# Patient Record
Sex: Male | Born: 1970 | Hispanic: Yes | Marital: Married | Smoking: Never smoker
Health system: Southern US, Community
[De-identification: ages and names within clinical notes are randomized; demographics above are authoritative.]

## PROBLEM LIST (undated history)

## (undated) DIAGNOSIS — E7800 Pure hypercholesterolemia, unspecified: Secondary | ICD-10-CM

## (undated) DIAGNOSIS — E119 Type 2 diabetes mellitus without complications: Secondary | ICD-10-CM

## (undated) DIAGNOSIS — K219 Gastro-esophageal reflux disease without esophagitis: Secondary | ICD-10-CM

## (undated) HISTORY — PX: APPENDECTOMY: SHX54

---

## 2006-07-12 ENCOUNTER — Emergency Department: Payer: Self-pay | Admitting: Emergency Medicine

## 2010-10-26 ENCOUNTER — Emergency Department: Payer: Self-pay | Admitting: Emergency Medicine

## 2011-04-24 ENCOUNTER — Observation Stay: Payer: Self-pay | Admitting: Internal Medicine

## 2014-01-16 ENCOUNTER — Emergency Department: Payer: Self-pay | Admitting: Emergency Medicine

## 2014-01-16 LAB — LIPASE, BLOOD: LIPASE: 152 U/L (ref 73–393)

## 2014-01-16 LAB — BASIC METABOLIC PANEL
Anion Gap: 4 — ABNORMAL LOW (ref 7–16)
BUN: 13 mg/dL (ref 7–18)
CALCIUM: 9.4 mg/dL (ref 8.5–10.1)
CHLORIDE: 106 mmol/L (ref 98–107)
CO2: 29 mmol/L (ref 21–32)
Creatinine: 0.88 mg/dL (ref 0.60–1.30)
EGFR (African American): >60
EGFR (Non-African Amer.): >60
Glucose: 97 mg/dL (ref 65–99)
Osmolality: 278 (ref 275–301)
POTASSIUM: 4.3 mmol/L (ref 3.5–5.1)
Sodium: 139 mmol/L (ref 136–145)

## 2014-01-16 LAB — HEPATIC FUNCTION PANEL A (ARMC)
ALBUMIN: 4.6 g/dL (ref 3.4–5.0)
AST: 34 U/L (ref 15–37)
Alkaline Phosphatase: 75 U/L
BILIRUBIN TOTAL: 1 mg/dL (ref 0.2–1.0)
Bilirubin, Direct: 0.2 mg/dL (ref 0.00–0.20)
SGPT (ALT): 46 U/L (ref 12–78)
Total Protein: 8.2 g/dL (ref 6.4–8.2)

## 2014-01-16 LAB — CBC
HCT: 43.9 % (ref 40.0–52.0)
HGB: 15 g/dL (ref 13.0–18.0)
MCH: 31.6 pg (ref 26.0–34.0)
MCHC: 34.2 g/dL (ref 32.0–36.0)
MCV: 93 fL (ref 80–100)
Platelet: 267 10*3/uL (ref 150–440)
RBC: 4.75 10*6/uL (ref 4.40–5.90)
RDW: 12.6 % (ref 11.5–14.5)
WBC: 9.6 10*3/uL (ref 3.8–10.6)

## 2014-01-16 LAB — TROPONIN I: Troponin-I: <0.02 ng/mL

## 2014-01-17 LAB — TROPONIN I: Troponin-I: <0.02 ng/mL

## 2014-01-17 LAB — CK: CK, Total: 278 U/L

## 2014-06-10 ENCOUNTER — Emergency Department: Payer: Self-pay | Admitting: Emergency Medicine

## 2014-09-25 ENCOUNTER — Emergency Department: Payer: Self-pay | Admitting: Emergency Medicine

## 2015-12-17 ENCOUNTER — Emergency Department
Admission: EM | Admit: 2015-12-17 | Discharge: 2015-12-17 | Disposition: A | Discharge status: Home / Self Care | Payer: BLUE CROSS/BLUE SHIELD | Attending: Emergency Medicine | Admitting: Emergency Medicine

## 2015-12-17 ENCOUNTER — Emergency Department: Payer: BLUE CROSS/BLUE SHIELD

## 2015-12-17 ENCOUNTER — Encounter: Payer: Self-pay | Admitting: Emergency Medicine

## 2015-12-17 DIAGNOSIS — R079 Chest pain, unspecified: Secondary | ICD-10-CM | POA: Diagnosis present

## 2015-12-17 DIAGNOSIS — Z79899 Other long term (current) drug therapy: Secondary | ICD-10-CM | POA: Diagnosis not present

## 2015-12-17 DIAGNOSIS — Z7984 Long term (current) use of oral hypoglycemic drugs: Secondary | ICD-10-CM | POA: Diagnosis not present

## 2015-12-17 DIAGNOSIS — E119 Type 2 diabetes mellitus without complications: Secondary | ICD-10-CM | POA: Insufficient documentation

## 2015-12-17 DIAGNOSIS — K219 Gastro-esophageal reflux disease without esophagitis: Secondary | ICD-10-CM

## 2015-12-17 DIAGNOSIS — M7032 Other bursitis of elbow, left elbow: Secondary | ICD-10-CM | POA: Insufficient documentation

## 2015-12-17 DIAGNOSIS — M7022 Olecranon bursitis, left elbow: Secondary | ICD-10-CM

## 2015-12-17 DIAGNOSIS — Y9389 Activity, other specified: Secondary | ICD-10-CM | POA: Insufficient documentation

## 2015-12-17 HISTORY — DX: Gastro-esophageal reflux disease without esophagitis: K21.9

## 2015-12-17 HISTORY — DX: Pure hypercholesterolemia, unspecified: E78.00

## 2015-12-17 HISTORY — DX: Type 2 diabetes mellitus without complications: E11.9

## 2015-12-17 LAB — CBC
HCT: 41 % (ref 40.0–52.0)
Hemoglobin: 14.2 g/dL (ref 13.0–18.0)
MCH: 31.3 pg (ref 26.0–34.0)
MCHC: 34.6 g/dL (ref 32.0–36.0)
MCV: 90.4 fL (ref 80.0–100.0)
PLATELETS: 243 10*3/uL (ref 150–440)
RBC: 4.54 MIL/uL (ref 4.40–5.90)
RDW: 12.3 % (ref 11.5–14.5)
WBC: 10.8 10*3/uL — ABNORMAL HIGH (ref 3.8–10.6)

## 2015-12-17 LAB — BASIC METABOLIC PANEL
Anion gap: 8 (ref 5–15)
BUN: 17 mg/dL (ref 6–20)
CHLORIDE: 102 mmol/L (ref 101–111)
CO2: 30 mmol/L (ref 22–32)
CREATININE: 0.93 mg/dL (ref 0.61–1.24)
Calcium: 9.2 mg/dL (ref 8.9–10.3)
GFR calc Af Amer: >60 mL/min (ref >60)
GFR calc non Af Amer: >60 mL/min (ref >60)
Glucose, Bld: 123 mg/dL — ABNORMAL HIGH (ref 65–99)
Potassium: 3.7 mmol/L (ref 3.5–5.1)
Sodium: 140 mmol/L (ref 135–145)

## 2015-12-17 LAB — TROPONIN I: Troponin I: <0.03 ng/mL (ref <0.031)

## 2015-12-17 MED ORDER — GI COCKTAIL ~~LOC~~
30.0000 mL | Freq: Once | ORAL | Status: AC
  Administered 2015-12-17: 30 mL via ORAL
  Filled 2015-12-17: qty 30

## 2015-12-17 MED ORDER — IBUPROFEN 600 MG PO TABS: ORAL_TABLET | ORAL | Status: DC

## 2015-12-17 MED ORDER — OMEPRAZOLE MAGNESIUM 20 MG PO TBEC: 20.0000 mg | DELAYED_RELEASE_TABLET | Freq: Two times a day (BID) | ORAL | Status: DC

## 2015-12-17 MED ORDER — KETOROLAC TROMETHAMINE 60 MG/2ML IM SOLN
60.0000 mg | Freq: Once | INTRAMUSCULAR | Status: AC
  Administered 2015-12-17: 60 mg via INTRAMUSCULAR
  Filled 2015-12-17: qty 2

## 2015-12-17 NOTE — ED Notes (Signed)
Pt. States recurrent chest pain in the past week.  Pt. States having brutitis of the lt. Elbow.  Pt. States tonight pain to lt. Elbow and epigastric pain unrelieved by omeprazole.

## 2015-12-17 NOTE — ED Provider Notes (Signed)
[facility] Emergency Department Provider Note  ____________________________________________  Time seen: Approximately 2:50 AM  I have reviewed the triage vital signs and the nursing notes.   HISTORY  Chief Complaint Chest Pain and Elbow Pain  The patient's parents speak Spanish.  They understand and have the right to the use of a hospital interpreter, however at this time they prefer to speak directly with me in Spanish.  They know that they can ask for an interpreter at any time.   HPI Fernando Vasquez is a 45 y.o. male with a past medical history of diabetes and acid reflux who presents with recurrent chest pain over the last 2 years.  States that he has seen [place] in [place] who encouraged him to use omeprazole but the symptoms seem to be getting gradually worse over time.  They are always worse after he eats and at night and when he lies flat.  He describes the pain as being substernal and going down to his epigastrium.  He has no nausea or vomiting.  He denies shortness of breath.  The symptoms are severe at times although currently they are mild.  He also presents tonight for evaluation of pain and swelling in his left elbow.  He is a Corporate investment bankerconstruction worker and within the last week has started noticing pain movement and swelling all the time.  It feels warm to them but is not red.  He has had no fever or chills.  He has had no trauma to the elbow for which he is aware.  Nothing is making the pain better and movement makes it worse.   Past Medical History  Diagnosis Date  . Diabetes mellitus without complication (HCC)   . GERD (gastroesophageal reflux disease)   . Elevated cholesterol     There are no active problems to display for this patient.   History reviewed. No pertinent past surgical history.  Current Outpatient Rx  Name  Route  Sig  Dispense  Refill  . metFORMIN (GLUCOPHAGE) 500 MG tablet   Oral   Take 500 mg by mouth 2  (two) times daily with a meal.         . omeprazole (PRILOSEC) 20 MG capsule   Oral   Take 20 mg by mouth daily.         . simvastatin (ZOCOR) 40 MG tablet   Oral   Take 40 mg by mouth daily.         Marland Kitchen. ibuprofen (ADVIL,MOTRIN) 600 MG tablet      Take 1 tablet by mouth three times daily with meals   15 tablet   0   . omeprazole (PRILOSEC OTC) 20 MG tablet   Oral   Take 1 tablet (20 mg total) by mouth 2 (two) times daily.   60 tablet   1     Allergies Review of patient's allergies indicates no known allergies.  History reviewed. No pertinent family history.  Social History Social History  Substance Use Topics  . Smoking status: Never Smoker   . Smokeless tobacco: None  . Alcohol Use: No    Review of Systems Constitutional: No fever/chills Eyes: No visual changes. ENT: No sore throat. Cardiovascular: Substernal and epigastric pain worse at night and after eating Respiratory: Denies shortness of breath. Gastrointestinal: Epigastric pain.  No nausea, no vomiting.  No diarrhea.  No constipation. Genitourinary: Negative for dysuria. Musculoskeletal: Pain and swelling in the left elbow Skin: Negative for rash. Neurological: Negative  for headaches, focal weakness or numbness.  10-point ROS otherwise negative.  ____________________________________________   PHYSICAL EXAM:  VITAL SIGNS: ED Triage Vitals  Enc Vitals Group     BP 12/17/15 0129 149/90 mmHg     Pulse Rate 12/17/15 0129 70     Resp 12/17/15 0129 18     Temp 12/17/15 0129 97.8 F (36.6 C)     Temp src --      SpO2 12/17/15 0129 100 %     Weight 12/17/15 0129 158 lb (71.668 kg)     Height 12/17/15 0129  (1.676 m)     Head Cir --      Peak Flow --      Pain Score 12/17/15 0131 6     Pain Loc --      Pain Edu? --      Excl. in GC? --     Constitutional: Alert and oriented. Well appearing and in no acute distress. Eyes: Conjunctivae are normal. PERRL. EOMI. Head: Atraumatic. Nose:  No congestion/rhinnorhea. Mouth/Throat: Mucous membranes are moist.  Oropharynx non-erythematous. Neck: No stridor.  No meningeal signs.   Cardiovascular: Normal rate, regular rhythm. Good peripheral circulation. Grossly normal heart sounds.   Respiratory: Normal respiratory effort.  No retractions. Lungs CTAB. Gastrointestinal: Soft and nontender. No distention.  Musculoskeletal: Swelling and tenderness to palpation of the left elbow consistent with bursitis.  There is no evidence of cellulitis or septic joint.  The patient is able to flex and extend his elbow with some tenderness but not the severe tenderness that would be expected with septic arthritis. Neurologic:  Normal speech and language. No gross focal neurologic deficits are appreciated.  Skin:  Skin is warm, dry and intact. No rash noted.   ____________________________________________   LABS (all labs ordered are listed, but only abnormal results are displayed)  Labs Reviewed  BASIC METABOLIC PANEL - Abnormal; Notable for the following:    Glucose, Bld 123 (*)    All other components within normal limits  CBC - Abnormal; Notable for the following:    WBC 10.8 (*)    All other components within normal limits  TROPONIN I   ____________________________________________  EKG  None ____________________________________________  RADIOLOGY   Dg Chest 2 View  12/17/2015  CLINICAL DATA:  Chest pain EXAM: CHEST  2 VIEW COMPARISON:  01/16/2014 FINDINGS: Normal heart size and mediastinal contours. No acute infiltrate or edema. No effusion or pneumothorax. Calcified pulmonary nodules compatible with previous granulomatous disease. Scarring versus azygos fissure at the right apex. No acute osseous findings. IMPRESSION: No active cardiopulmonary disease. Electronically Signed   By: Marnee Spring M.D.   On: 12/17/2015 03:01   Dg Elbow Complete Left  12/17/2015  CLINICAL DATA:  Left elbow pain for 2 days. No known injury. History of  bursitis. EXAM: LEFT ELBOW - COMPLETE 3+ VIEW COMPARISON:  None. FINDINGS: There is no evidence of fracture, dislocation, or joint effusion. There is no evidence of arthropathy or other focal bone abnormality. Small olecranon spur with associated soft tissue prominence in the region of the olecranon bursa. IMPRESSION: Small olecranon spur with associated soft tissue prominence in the region of the olecranon bursa, suspicious for olecranon bursitis. Electronically Signed   By: Rubye Oaks M.D.   On: 12/17/2015 02:59    ____________________________________________   PROCEDURES  Procedure(s) performed: None  Critical Care performed: No ____________________________________________   INITIAL IMPRESSION / ASSESSMENT AND PLAN / ED COURSE  Pertinent labs & imaging results that  were available during my care of the patient were reviewed by me and considered in my medical decision making (see chart for details).  The signs and symptoms the patient describes are consistent with worsening acid reflux.  He was given a GI cocktail in the emergency department which completely resolved his symptoms.  His lab results are reassuring.  His chest x-ray is unremarkable and his left elbow is consistent with bursitis correlates clinically.  I recommended that he use a compression wrap and he was given an Ace bandage.  I instructed him to use NSAIDs and continue to use the elbow.  I gave him follow-up information for about a week with orthopedics and encouraged him to follow up sooner if it becomes red, increasing swelling, or if he develops other symptoms such as fever.  Also follow-up with his outpatient doctor regarding the acid reflux and I encouraged him to take omeprazole twice daily instead of once a day.  I gave my usual and customary return precautions.     ____________________________________________  FINAL CLINICAL IMPRESSION(S) / ED DIAGNOSES  Final diagnoses:  Gastroesophageal reflux disease,  esophagitis presence not specified  Bursitis of elbow, left      NEW MEDICATIONS STARTED DURING THIS VISIT:  New Prescriptions   IBUPROFEN (ADVIL,MOTRIN) 600 MG TABLET    Take 1 tablet by mouth three times daily with meals   OMEPRAZOLE (PRILOSEC OTC) 20 MG TABLET    Take 1 tablet (20 mg total) by mouth 2 (two) times daily.      Note:  This document was prepared using Dragon voice recognition software and may include unintentional dictation errors.   Loleta Rose, MD 12/17/15 [phone]

## 2015-12-17 NOTE — Discharge Instructions (Signed)
Bursitis del codo (Elbow Bursitis) La bursitis del codo es la inflamacin de la bolsa llena de lquido (bursa) que se encuentra entre la punta del hueso del codo (olcranon) y la piel. La bursitis del codo tambin se llama bursitis olecraniana. Normalmente, la bursa [place] tiene solo una pequea cantidad de lquido para Administrator, Civil Service y Conservator, museum/gallery el hueso del codo. La bursitis del codo hace que el lquido se acumule dentro de la bursa. Con el tiempo, esta hinchazn y la inflamacin pueden provocar dolor al flexionar o apoyar el codo.  CAUSAS Las causas de la bursitis del codo pueden ser las siguientes:   Una lesin en el codo (traumatismo agudo).  Apoyarse en superficies duras durante perodos prolongados.  Una infeccin por una lesin que desgarra la piel cerca del codo.  Un crecimiento seo (espoln) que se forma en la punta del codo.  Una afeccin mdica que causa inflamacin en el cuerpo, como gota o artritis reumatoide. La causa puede ser desconocida.  SIGNOS Y SNTOMAS  El primer signo de la bursitis del codo suele ser la hinchazn en la punta del codo. Esta puede aumentar hasta el tamao de una pelota de golf. El proceso puede comenzar de repente o gradualmente. Tambin puede tener lo siguiente:  Dolor al flexionar o apoyar el codo.  Movimiento restringido del codo. Si la causa de la bursitis es una infeccin, los sntomas tambin pueden incluir lo siguiente:  Comptroller, calor y dolor con la palpacin en el codo.  Secrecin de pus en la zona hinchada sobre el codo, si la piel se abre. DIAGNSTICO  El mdico puede diagnosticar bursitis del codo en funcin de sus signos y sntomas, en especial si se lesion recientemente. Tambin Civil engineer, contracting un examen fsico. Esto puede incluir lo siguiente:  Radiografas para buscar algn espoln seo o una fractura.  Extraccin de lquido de la bursa para determinar si hay una infeccin.  Anlisis de sangre para descartar gota o  artritis reumatoide. TRATAMIENTO  El tratamiento de la bursitis del codo depende de la causa. El tratamiento puede incluir lo siguiente:  Medicamentos. Estos pueden incluir lo siguiente:  Medicamentos de venta libre para Engineer, materials y la inflamacin.  Antibiticos para combatir la infeccin.  Inyecciones de antiinflamatorios (corticoides).  Utilizar vendajes en el codo.  Extraer lquido de la bursa.  Usar coderas. Si la bursitis no mejora con el tratamiento, es posible que deba someterse a una ciruga para extraer la bursa.  INSTRUCCIONES PARA EL CUIDADO EN EL HOGAR   Tome los medicamentos solamente como se lo haya indicado el mdico.  Si le recetaron antibiticos, asegrese de terminarlos, incluso si comienza a sentirse mejor.  Si la causa de la bursitis es una lesin, deje el codo en reposo y use una venda como se lo haya indicado el mdico. Tambin puede aplicar hielo en la zona lesionada como le haya indicado el mdico:  Ponga el hielo en una bolsa plstica.  Coloque una toalla entre la piel y la bolsa de hielo.  Coloque el hielo durante , 2 a 3veces por Futures trader.  Evite las SUPERVALU INC causen dolor en el codo.  Use coderas o vendas para proteger el codo. SOLICITE ATENCIN MDICA SI:  Lance Muss.  Los sntomas no mejoran con Scientist, research (medical).  El dolor o la hinchazn [place].  El dolor o la hinchazn en el codo desaparecen y pero vuelven a Research officer, trade union.  Tiene secrecin de pus en la zona hinchada sobre el codo.   Esta informacin  no tiene Theme park manager el consejo del mdico. Asegrese de hacerle al mdico cualquier pregunta que tenga.   Document Released: 01/10/2009 Document Revised: 10/15/2014 Elsevier Interactive Patient Education 2016 Elsevier Inc.  Enfermedad por reflujo gastroesofgico en los adultos (Gastroesophageal Reflux Disease, Adult) Normalmente, los alimentos descienden por el esfago y se depositan en el estmago para su  digestin. Sin embargo, cuando una persona tiene enfermedad por reflujo gastroesofgico (ERGE), los alimentos y el cido estomacal regresan al esfago. Cuando esto ocurre, el esfago se irrita y se inflama. Con el tiempo, la ERGE puede provocar la formacin de pequeas perforaciones (lceras) en la mucosa del esfago.  CAUSAS Un problema del msculo que se encuentra entre el esfago y Investment banker, corporate (esfnter esofgico inferior o EEI) es la causa de esta enfermedad. Por lo general, el esfnter esofgico inferior se cierra despus de que los alimentos pasan a travs del esfago hacia el [place]. Cuando el EEI est debilitado o no es normal, no se cierra correctamente, lo que permite el paso retrgrado de los alimentos y el cido estomacal al esfago. Algunas sustancias de la dieta, algunos medicamentos y Materials engineer enfermedades pueden debilitar este esfnter, entre ellos:  Consumo de tabaco.  [place].  Hernia de hiato.  Consumo excesivo de alcohol.  Algunos alimentos y [address], como el caf, el chocolate, las cebollas y Interior and spatial designer. FACTORES DE RIESGO Es ms probable que esta afeccin se manifieste en:  Los personas con sobrepeso.  Las personas con trastornos del tejido conjuntivo.  Las personas que toman antiinflamatorios no esteroides (AINE). SNTOMAS Los sntomas de esta afeccin incluyen lo siguiente:  Merchant navy officer.  Dificultad o dolor al tragar.  Sensacin de Warehouse manager un bulto en la garganta.  Sabor amargo en la boca.  Mal aliento.  Gran cantidad de saliva.  Malestar estomacal o meteorismo.  Flatulencias.  Dolor en el pecho.  Falta de aire o sibilancias.  Tos permanente (crnica) o tos nocturna.  Desgaste el esmalte dental.  Prdida de peso. El dolor en el pecho puede deberse a muchas afecciones diferentes. Consulte al mdico si tiene Journalist, newspaper. DIAGNSTICO El mdico le har una historia clnica y un examen fsico. Para determinar si la ERGE es leve o  grave, el mdico tambin puede controlar la respuesta al [place]. Tambin pueden hacerle otros estudios, por ejemplo:  Una endoscopia para examinar el estmago y el esfago con Neomia Dear pequea cmara.  Un estudio que determina el nivel de acidez en el esfago.  Un estudio que mide la presin que hay en el esfago.  Un estudio de deglucin de bario o un estudio modificado de deglucin de bario para mostrar la forma, el tamao y el funcionamiento del esfago. TRATAMIENTO El objetivo del tratamiento es aliviar los sntomas y Automotive engineer las complicaciones. El tratamiento de esta afeccin puede variar en funcin de la gravedad de los sntomas. El mdico podr indicar lo siguiente:  Cambios en la dieta.  Medicamentos.  Ciruga. INSTRUCCIONES PARA EL CUIDADO EN EL HOGAR Dieta  Siga la dieta que le haya recomendado el mdico, la cual puede incluir evitar alimentos y bebidas tales como:  Caf y t (con o sin cafena).  Bebidas que contengan alcohol.  Bebidas energizantes y deportivas.  Gaseosas o refrescos.  Chocolate y cacao.  Menta y esencias de [address]  Ajo y cebollas.  Rbano picante.  Alimentos muy condimentados y cidos, entre ellos, pimientos, [place] en polvo, curry en polvo, vinagre, salsas picantes y [address]  Frutas ctricas y sus  jugos, como naranjas, limones y limas.  Alimentos a base de tomates, como salsa roja, [place], salsa y pizza con salsa roja.  Alimentos fritos y Lexicographer, como rosquillas, papas fritas y aderezos con alto contenido de Holiday representative.  Carnes con alto contenido de [place], como hot dogs y cortes grasos de carnes rojas y blancas, por ejemplo, filetes de entrecot, salchicha, jamn y tocino.  Productos lcteos con alto contenido de [place], como [place], [place] y queso crema.  Haga comidas pequeas y frecuentes Freight forwarder de comidas abundantes.  Evite beber mucho lquido con las comidas.  No coma durante las 2 o 3horas previas a la  hora de [place].  No se acueste inmediatamente despus de comer.  No haga actividad fsica enseguida despus de comer. Instrucciones generales  Est atento a cualquier cambio en los sntomas.  Tome los medicamentos de venta libre y los recetados solamente como se lo haya indicado el mdico. No tome aspirina, ibuprofeno ni otros antiinflamatorios no esteroides (AINE), a menos que se lo haya indicado el mdico.  No consuma ningn producto que contenga tabaco, lo que incluye cigarrillos, tabaco de Theatre manager y Administrator, Civil Service. Si necesita ayuda para dejar de fumar, consulte al mdico.  Use ropas sueltas. No use prendas ajustadas alrededor de la cintura que ejerzan presin en el abdomen.  Levante (eleve) 6pulgadas (15centmetros) la cabecera de la cama.  Trate de reducir [employer] de estrs con actividades como el yoga o la meditacin. Si necesita ayuda para reducir [employer] de estrs, consulte al mdico.  Si tiene sobrepeso, Media planner un peso saludable. Hable con el mdico acerca de su peso ideal y pdale asesoramiento en cuanto a la dieta que debe seguir para Aeronautical engineer.  Concurra a todas las visitas de control como se lo haya indicado el mdico. Esto es importante. SOLICITE ATENCIN MDICA SI:  Aparecen nuevos sntomas.  Baja de peso sin causa aparente.  Tiene dificultad para tragar o siente dolor al [employer].  Tiene sibilancias o tos persistente.  Los sntomas no mejoran con Scientist, research (medical).  Tiene la voz ronca. SOLICITE ATENCIN MDICA DE Engel[employer] Corporation SI:  Tiene dolor en los brazos, el cuello, los Jet, la dentadura o la espalda.  Berenice Primas, se marea o tiene sensacin de desvanecimiento.  Siente falta de aire o Journalist, newspaper.  Vomita y el vmito es parecido a la sangre o a los granos de caf.  Se desmaya.  Las heces son sanguinolentas o de color negro.  No puede tragar, beber o comer.   Esta informacin no tiene Theme park manager  el consejo del mdico. Asegrese de hacerle al mdico cualquier pregunta que tenga.   Document Released: 07/04/2005 Document Revised: 06/15/2015 Elsevier Interactive Patient Education 2016 [employer].  Opciones de alimentos para pacientes con reflujo gastroesofgico - Adultos (Food Choices for Gastroesophageal Reflux Disease, Adult) Cuando se tiene reflujo gastroesofgico (ERGE), los alimentos que se ingieren y los hbitos de alimentacin son Engineer, production. Elegir los alimentos adecuados puede ayudar a Paramedic las molestias ocasionadas por el [place]. QU PAUTAS GENERALES DEBO SEGUIR?  Elija las frutas, los vegetales, los cereales integrales, los productos lcteos, la carne de [place], de pescado y de ave con bajo contenido de grasas.  Limite las grasas, [address] [place], los aderezos para [place], la [place], los frutos secos y Programme researcher, broadcasting/film/video.  Lleve un registro de las comidas para identificar los alimentos que ocasionan sntomas.  Evite los alimentos que le ocasionen reflujo. Pueden ser distintos  para cada persona.  Haga comidas pequeas con frecuencia en lugar de tres comidas OfficeMax Incorporatedabundantes todos los das.  Coma lentamente, en un clima distendido.  Limite el consumo de alimentos fritos.  Cocine los alimentos utilizando mtodos que no sean la fritura.  Evite el consumo alcohol.  Evite beber grandes cantidades de lquidos con las comidas.  Evite agacharse o recostarse hasta despus de 2 o 3horas de haber comido. QU ALIMENTOS NO SE RECOMIENDAN? Los siguientes son algunos alimentos y bebidas que pueden empeorar los sntomas: Veterinary surgeonVegetales Tomates. Jugo de tomate. Salsa de tomate y espagueti. Ajes. Cebolla y [place]. Rbano picante. Frutas Naranjas, pomelos y limn (fruta y [place]jugo). Carnes Carnes de [place], de pescado y de ave con gran contenido de grasas. Esto incluye los perros calientes, las Robinwoodcostillas, el Kootenaijamn, la salchicha, el salame y el tocino. Lcteos Leche entera y Ricevilleleche chocolatada.  PPG IndustriesCrema cida. Crema. [place]. Helados. Queso crema.  Bebidas Caf y t negro, con o sin cafena Bebidas gaseosas o energizantes. Condimentos Salsa picante. Salsa barbacoa.  Dulces/postres Chocolate y cacao. Rosquillas. Menta y mentol. Grasas y Massachusetts Mutual Lifeaceites Alimentos con alto contenido de grasas, incluidas las papas fritas. Otros Vinagre. Especias picantes, como la Brink's Companypimienta negra, la pimienta blanca, la pimienta roja, la pimienta de cayena, el curry en [place], los clavos de Pippa Passesolor, el jengibre y el [place] en polvo. Los artculos mencionados arriba pueden no ser Raytheonuna lista completa de las bebidas y los alimentos que se Theatre stage managerdeben evitar. Comunquese con el nutricionista para recibir ms informacin.   Esta informacin no tiene Theme park managercomo fin reemplazar el consejo del mdico. Asegrese de hacerle al mdico cualquier pregunta que tenga.   Document Released: 07/04/2005 Document Revised: 10/15/2014 Elsevier Interactive Patient Education [employer]2016 Elsevier Inc.

## 2017-04-14 IMAGING — CR DG ELBOW COMPLETE 3+V*L*
4 series · 4 of 4 positions shown · non-contrast
Comparison: None.

CLINICAL DATA: Left elbow pain for 2 days. No known injury. History
of bursitis.

EXAM:
LEFT ELBOW - COMPLETE 3+ VIEW

[elbow ap]
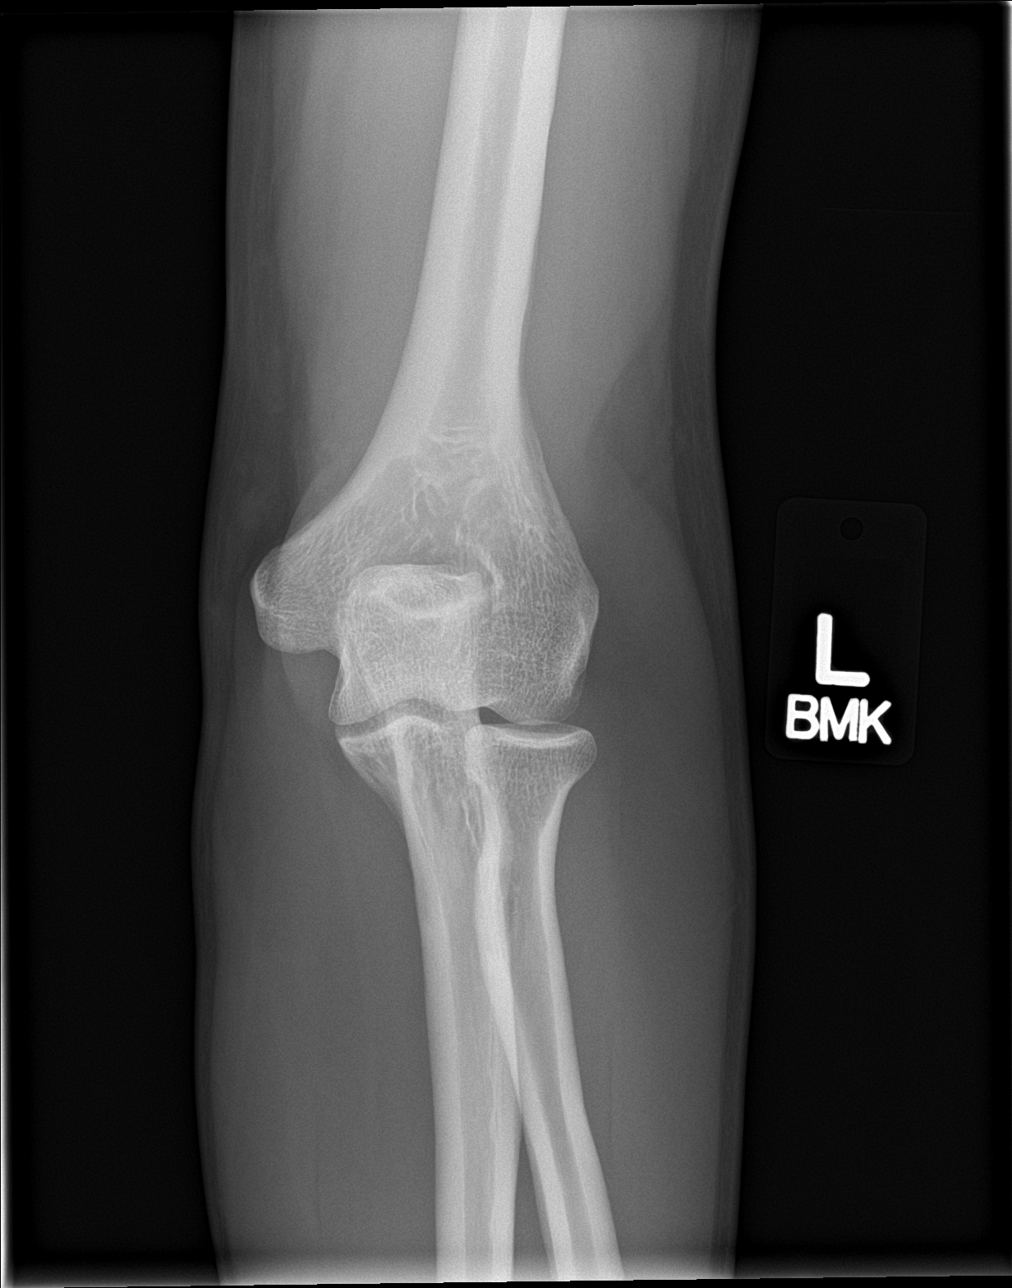

[elbow obl (1 of 2)]
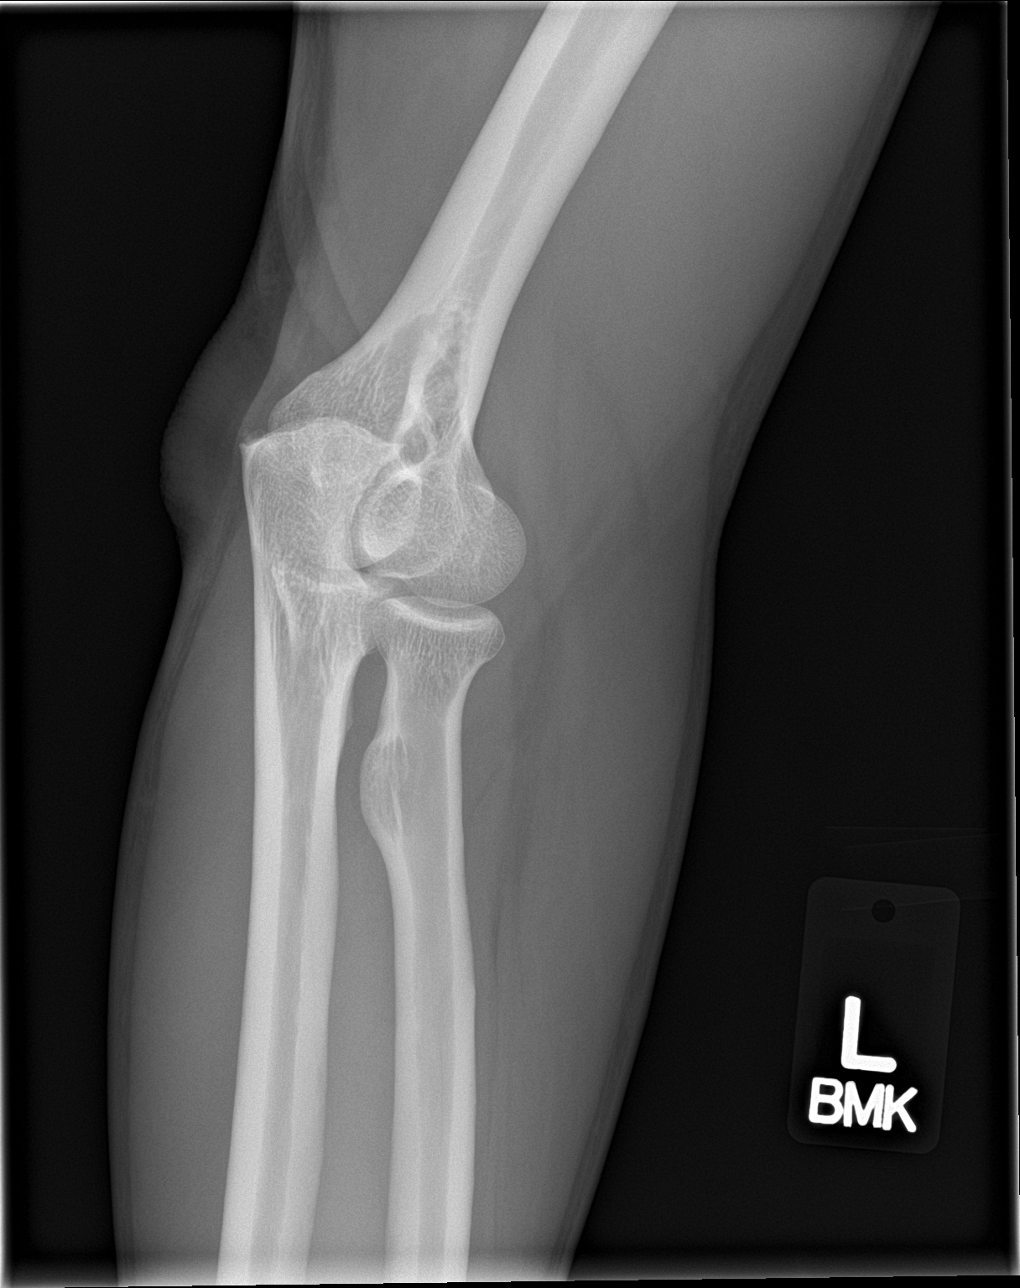

[elbow obl (2 of 2)]
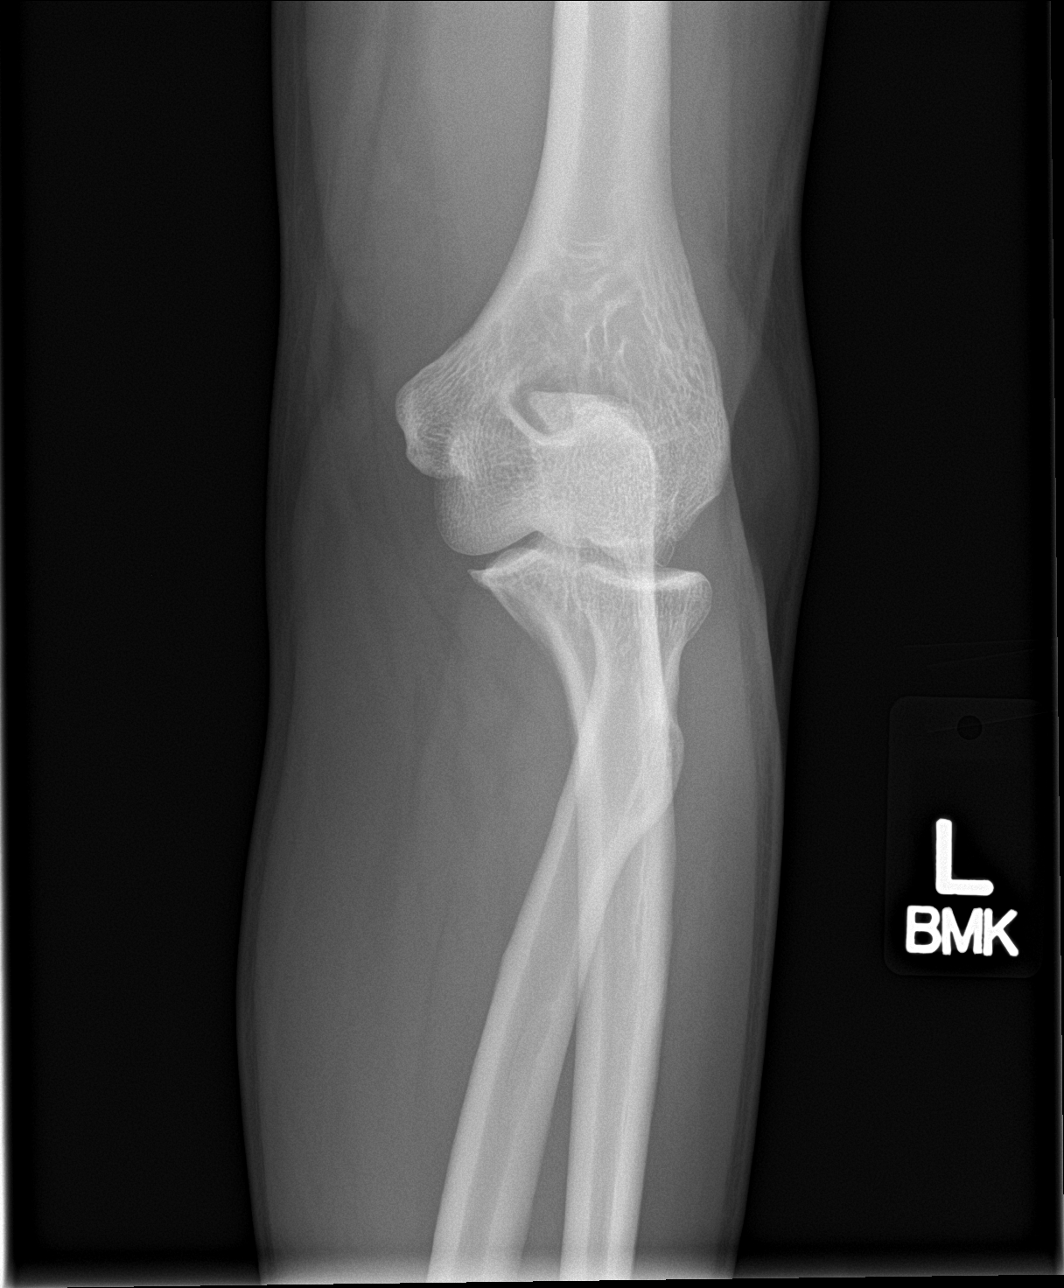

[elbow lat]
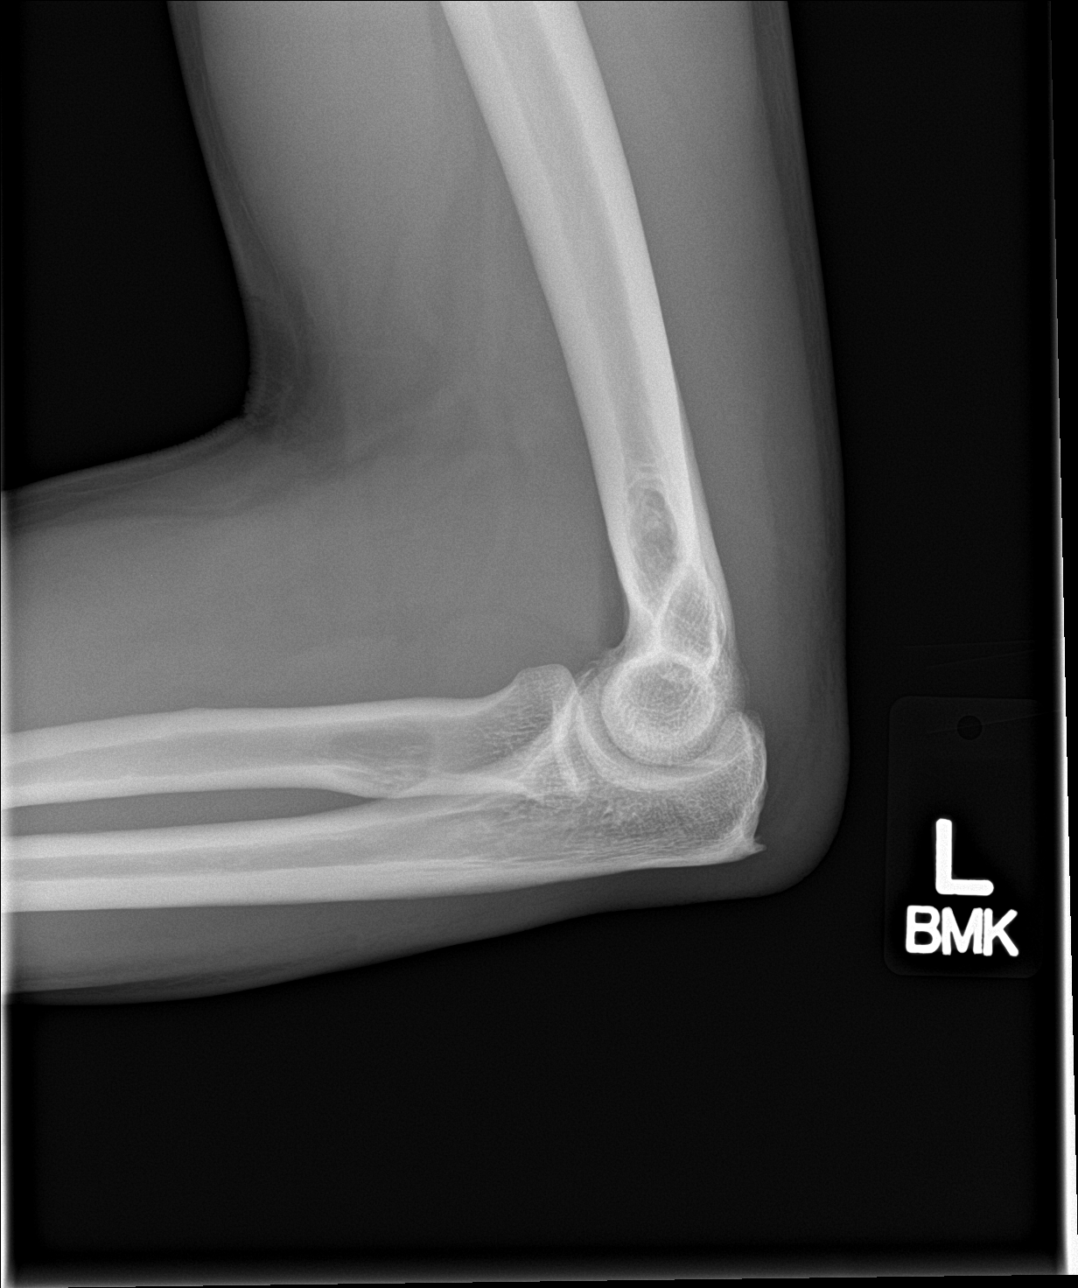

[4 of 4 positions shown; findings below may reference images not displayed]

FINDINGS: There is no evidence of fracture, dislocation, or joint effusion.
There is no evidence of arthropathy or other focal bone abnormality.
Small olecranon spur with associated soft tissue prominence in the
region of the olecranon bursa.
IMPRESSION: Small olecranon spur with associated soft tissue prominence in the
region of the olecranon bursa, suspicious for olecranon bursitis.

## 2018-03-22 ENCOUNTER — Emergency Department
Admission: EM | Admit: 2018-03-22 | Discharge: 2018-03-22 | Disposition: A | Discharge status: Home / Self Care | Payer: BLUE CROSS/BLUE SHIELD | Attending: Emergency Medicine | Admitting: Emergency Medicine

## 2018-03-22 ENCOUNTER — Encounter: Payer: Self-pay | Admitting: Emergency Medicine

## 2018-03-22 ENCOUNTER — Emergency Department: Payer: Self-pay

## 2018-03-22 ENCOUNTER — Other Ambulatory Visit: Payer: Self-pay

## 2018-03-22 DIAGNOSIS — E119 Type 2 diabetes mellitus without complications: Secondary | ICD-10-CM | POA: Insufficient documentation

## 2018-03-22 DIAGNOSIS — Z7984 Long term (current) use of oral hypoglycemic drugs: Secondary | ICD-10-CM | POA: Insufficient documentation

## 2018-03-22 DIAGNOSIS — R509 Fever, unspecified: Secondary | ICD-10-CM | POA: Insufficient documentation

## 2018-03-22 DIAGNOSIS — R519 Headache, unspecified: Secondary | ICD-10-CM

## 2018-03-22 DIAGNOSIS — R51 Headache: Secondary | ICD-10-CM | POA: Insufficient documentation

## 2018-03-22 LAB — GROUP A STREP BY PCR: Group A Strep by PCR: NOT DETECTED

## 2018-03-22 LAB — CBC WITH DIFFERENTIAL/PLATELET
Basophils Absolute: 0 10*3/uL (ref 0–0.1)
Basophils Relative: 0 %
Eosinophils Absolute: 0 10*3/uL (ref 0–0.7)
Eosinophils Relative: 0 %
HEMATOCRIT: 42.7 % (ref 40.0–52.0)
HEMOGLOBIN: 14.7 g/dL (ref 13.0–18.0)
Lymphocytes Relative: 13 %
Lymphs Abs: 1.1 10*3/uL (ref 1.0–3.6)
MCH: 31.8 pg (ref 26.0–34.0)
MCHC: 34.4 g/dL (ref 32.0–36.0)
MCV: 92.2 fL (ref 80.0–100.0)
MONO ABS: 0.6 10*3/uL (ref 0.2–1.0)
MONOS PCT: 7 %
NEUTROS ABS: 6.7 10*3/uL — AB (ref 1.4–6.5)
NEUTROS PCT: 80 %
Platelets: 281 10*3/uL (ref 150–440)
RBC: 4.63 MIL/uL (ref 4.40–5.90)
RDW: 12.6 % (ref 11.5–14.5)
WBC: 8.3 10*3/uL (ref 3.8–10.6)

## 2018-03-22 LAB — URINALYSIS, COMPLETE (UACMP) WITH MICROSCOPIC
BACTERIA UA: NONE SEEN
BILIRUBIN URINE: NEGATIVE
Hgb urine dipstick: NEGATIVE
KETONES UR: 20 mg/dL — AB
Leukocytes, UA: NEGATIVE
Nitrite: NEGATIVE
PH: 8 (ref 5.0–8.0)
Protein, ur: NEGATIVE mg/dL
Specific Gravity, Urine: 1.016 (ref 1.005–1.030)
Squamous Epithelial / LPF: NONE SEEN (ref 0–5)

## 2018-03-22 LAB — COMPREHENSIVE METABOLIC PANEL
ALBUMIN: 4.8 g/dL (ref 3.5–5.0)
ALK PHOS: 82 U/L (ref 38–126)
ALT: 58 U/L (ref 17–63)
ANION GAP: 12 (ref 5–15)
AST: 33 U/L (ref 15–41)
BUN: 11 mg/dL (ref 6–20)
CHLORIDE: 98 mmol/L — AB (ref 101–111)
CO2: 26 mmol/L (ref 22–32)
Calcium: 9.5 mg/dL (ref 8.9–10.3)
Creatinine, Ser: 0.96 mg/dL (ref 0.61–1.24)
GFR calc non Af Amer: >60 mL/min (ref >60)
GLUCOSE: 254 mg/dL — AB (ref 65–99)
Potassium: 4.1 mmol/L (ref 3.5–5.1)
Sodium: 136 mmol/L (ref 135–145)
Total Bilirubin: 1.8 mg/dL — ABNORMAL HIGH (ref 0.3–1.2)
Total Protein: 7.8 g/dL (ref 6.5–8.1)

## 2018-03-22 MED ORDER — KETOROLAC TROMETHAMINE 30 MG/ML IJ SOLN
15.0000 mg | Freq: Once | INTRAMUSCULAR | Status: AC
  Administered 2018-03-22: 15 mg via INTRAMUSCULAR
  Filled 2018-03-22: qty 1

## 2018-03-22 MED ORDER — ACETAMINOPHEN 500 MG PO TABS
1000.0000 mg | ORAL_TABLET | Freq: Once | ORAL | Status: AC
  Administered 2018-03-22: 1000 mg via ORAL
  Filled 2018-03-22: qty 2

## 2018-03-22 MED ORDER — KETOROLAC TROMETHAMINE 10 MG PO TABS: 10.0000 mg | ORAL_TABLET | Freq: Four times a day (QID) | ORAL | 0 refills | Status: DC | PRN

## 2018-03-22 MED ORDER — SODIUM CHLORIDE 0.9 % IV BOLUS
1000.0000 mL | Freq: Once | INTRAVENOUS | Status: AC
  Administered 2018-03-22: 1000 mL via INTRAVENOUS

## 2018-03-22 NOTE — ED Notes (Signed)
Pt ambulatory to toilet to urinate with steady gait noted.

## 2018-03-22 NOTE — ED Notes (Signed)
Pt given meal tray and drink, ok with provider.

## 2018-03-22 NOTE — ED Provider Notes (Signed)
[facility] Emergency Department Provider Note  ____________________________________________   First MD Initiated Contact with Patient 03/22/18 1104     (approximate)  I have reviewed the triage vital signs and the nursing notes.   HISTORY  Chief Complaint Fever and Headache   HPI Fernando Vasquez is a 47 y.o. male who presents to the emergency department for treatment and evaluation of fever and headache that started yesterday. Headache started first, then fever. He took ibuprofen last night, but nothing today.  Past Medical History:  Diagnosis Date  . Diabetes mellitus without complication (HCC)   . Elevated cholesterol   . GERD (gastroesophageal reflux disease)     There are no active problems to display for this patient.   History reviewed. No pertinent surgical history.  Prior to Admission medications   Medication Sig Start Date End Date Taking? Authorizing Provider  ketorolac (TORADOL) 10 MG tablet Take 1 tablet (10 mg total) by mouth every 6 (six) hours as needed. 03/22/18   Dimitri Shakespeare, Rulon Eisenmengerari B, FNP  metFORMIN (GLUCOPHAGE) 500 MG tablet Take 500 mg by mouth 2 (two) times daily with a meal.    [provider]  omeprazole (PRILOSEC OTC) 20 MG tablet Take 1 tablet (20 mg total) by mouth 2 (two) times daily. 12/17/15 12/16/16  Loleta RoseForbach, Cory, MD  omeprazole (PRILOSEC) 20 MG capsule Take 20 mg by mouth daily.    [provider]  simvastatin (ZOCOR) 40 MG tablet Take 40 mg by mouth daily.    [provider]    Allergies Patient has no known allergies.  No family history on file.  Social History Social History   Tobacco Use  . Smoking status: Never Smoker  . Smokeless tobacco: Never Used  Substance Use Topics  . Alcohol use: No  . Drug use: Not on file    Review of Systems  Constitutional: Positive for fever/chills Eyes: No visual changes. ENT: No sore throat. Cardiovascular: Denies chest  pain. Respiratory: Denies shortness of breath. Gastrointestinal: No abdominal pain.  No nausea, no vomiting.  No diarrhea.  No constipation. Genitourinary: Negative for dysuria. Musculoskeletal: Negative for back pain. Negative for neck stiffness. Skin: Negative for rash. Neurological: Positive for headaches, focal weakness or numbness. ____________________________________________   PHYSICAL EXAM:  VITAL SIGNS: ED Triage Vitals  Enc Vitals Group     BP 03/22/18 1056 (!) 136/97     Pulse Rate 03/22/18 1056 (!) 104     Resp 03/22/18 1056 18     Temp 03/22/18 1056 99.2 F (37.3 C)     Temp Source 03/22/18 1056 Oral     SpO2 03/22/18 1056 99 %     Weight 03/22/18 1054 150 lb (68 kg)     Height 03/22/18 1054 5\' 7"  (1.702 m)     Head Circumference --      Peak Flow --      Pain Score 03/22/18 1054 8     Pain Loc --      Pain Edu? --      Excl. in GC? --     Constitutional: Alert and oriented. Well appearing and in no acute distress. No meningismus.  Eyes: Chronic photokeratitis of the right eye.  Head: Atraumatic. Nose: No congestion/rhinnorhea. Mouth/Throat: Mucous membranes are moist.  Oropharynx non-erythematous. Neck: No stridor.  Cardiovascular: Normal rate, regular rhythm. Grossly normal heart sounds.  Good peripheral circulation. Respiratory: Normal respiratory effort.  No retractions. Lungs CTAB. Gastrointestinal: Soft and nontender. No distention. No abdominal  bruits. No CVA tenderness. Bowel sounds present and active x 4. Musculoskeletal: No lower extremity tenderness nor edema.  No joint effusions. Neurologic:  Normal speech and language. No gross focal neurologic deficits are appreciated. No gait instability.   Skin:  Skin is warm, dry and intact. No rash noted. Psychiatric: Mood and affect are normal. Speech and behavior are normal.  ____________________________________________   LABS (all labs ordered are listed, but only abnormal results are  displayed)  Labs Reviewed  COMPREHENSIVE METABOLIC PANEL - Abnormal; Notable for the following components:      Result Value   Chloride 98 (*)    Glucose, Bld 254 (*)    Total Bilirubin 1.8 (*)    All other components within normal limits  CBC WITH DIFFERENTIAL/PLATELET - Abnormal; Notable for the following components:   Neutro Abs 6.7 (*)    All other components within normal limits  URINALYSIS, COMPLETE (UACMP) WITH MICROSCOPIC - Abnormal; Notable for the following components:   Color, Urine YELLOW (*)    APPearance CLEAR (*)    Glucose, UA >=500 (*)    Ketones, ur 20 (*)    All other components within normal limits  GROUP A STREP BY PCR   ____________________________________________  EKG  Not indicated. ____________________________________________  RADIOLOGY  ED MD interpretation:  No infiltrates observed on chest x-ray.  Official radiology report(s): Dg Chest 2 View  Result Date: 03/22/2018 CLINICAL DATA:  Fever of unknown origin EXAM: CHEST - 2 VIEW COMPARISON:  12/17/2015 FINDINGS: Heart and mediastinal contours are within normal limits. No focal opacities or effusions. No acute bony abnormality. IMPRESSION: No active cardiopulmonary disease. Electronically Signed   By: Charlett Nose M.D.   On: 03/22/2018 14:29    ____________________________________________   PROCEDURES  Procedure(s) performed: None  Procedures  Critical Care performed: No  ____________________________________________   INITIAL IMPRESSION / ASSESSMENT AND PLAN / ED COURSE  47 year old male presenting to the emergency department for treatment and evaluation of fever and headache. No other symptoms of concern. No known exposures. He is a diabetic, but reports compliance with his medication. Differential diagnosis includes but is not limited to viral syndrome, dehydration, strep pharyngitis, meningitis.   ----------------------------------------- 2:07 PM on  03/22/2018 -----------------------------------------  Some improvement of the headache. He is up and walking around the room without assistance. Second liter of fluids ordered due to ketones in the urine and toradol IV. Chest x-ray will be added for completeness sake due to fever without specific finding at this point.  ----------------------------------------- 2:41 PM on 03/22/2018 -----------------------------------------  Chest x-ray negative for acute findings per radiology. He will be discharged home after 2nd liter of NS infuses. Headache much improved. He is to follow up with the PCP of his choice or return to the ER for symptoms of concern.  ____________________   FINAL CLINICAL IMPRESSION(S) / ED DIAGNOSES  Final diagnoses:  Acute intractable headache, unspecified headache type  Fever, unspecified fever cause     ED Discharge Orders        Ordered    ketorolac (TORADOL) 10 MG tablet  Every 6 hours PRN     03/22/18 1435       Note:  This document was prepared using Dragon voice recognition software and may include unintentional dictation errors.    Chinita Pester, FNP 03/22/18 1534    Sharman Cheek, MD 03/22/18 [phone]

## 2018-03-22 NOTE — Discharge Instructions (Signed)
Please follow up with the primary care provider of your choice for symptoms that are not improving over the next few days. ° °Return to the ER for symptoms that change or worsen if unable to schedule an appointment. °

## 2018-03-22 NOTE — ED Notes (Signed)
Pt ambulatory to toilet with steady gait noted.  

## 2018-03-22 NOTE — ED Triage Notes (Signed)
First Nurse Note:  C/O fever and headache x 1 day.  States headache started first, and then patient noticed fever.  Has not medicated for pain/ fever today.  Motrin last taken last night.

## 2021-05-07 ENCOUNTER — Other Ambulatory Visit: Payer: Self-pay

## 2021-05-07 ENCOUNTER — Encounter: Payer: Self-pay | Admitting: Emergency Medicine

## 2021-05-07 ENCOUNTER — Emergency Department: Payer: Self-pay

## 2021-05-07 ENCOUNTER — Emergency Department
Admission: EM | Admit: 2021-05-07 | Discharge: 2021-05-07 | Disposition: A | Discharge status: Home / Self Care | Payer: Self-pay | Attending: Emergency Medicine | Admitting: Emergency Medicine

## 2021-05-07 DIAGNOSIS — Z1152 Encounter for screening for COVID-19: Secondary | ICD-10-CM

## 2021-05-07 DIAGNOSIS — E1169 Type 2 diabetes mellitus with other specified complication: Secondary | ICD-10-CM | POA: Insufficient documentation

## 2021-05-07 DIAGNOSIS — J069 Acute upper respiratory infection, unspecified: Secondary | ICD-10-CM | POA: Insufficient documentation

## 2021-05-07 DIAGNOSIS — E7800 Pure hypercholesterolemia, unspecified: Secondary | ICD-10-CM | POA: Insufficient documentation

## 2021-05-07 DIAGNOSIS — U071 COVID-19: Secondary | ICD-10-CM | POA: Insufficient documentation

## 2021-05-07 DIAGNOSIS — Z79899 Other long term (current) drug therapy: Secondary | ICD-10-CM | POA: Insufficient documentation

## 2021-05-07 DIAGNOSIS — Z760 Encounter for issue of repeat prescription: Secondary | ICD-10-CM | POA: Insufficient documentation

## 2021-05-07 DIAGNOSIS — E1165 Type 2 diabetes mellitus with hyperglycemia: Secondary | ICD-10-CM

## 2021-05-07 DIAGNOSIS — Z7984 Long term (current) use of oral hypoglycemic drugs: Secondary | ICD-10-CM | POA: Insufficient documentation

## 2021-05-07 LAB — CBG MONITORING, ED: Glucose-Capillary: 228 mg/dL — ABNORMAL HIGH (ref 70–99)

## 2021-05-07 MED ORDER — METFORMIN HCL 500 MG PO TABS: 500.0000 mg | ORAL_TABLET | Freq: Two times a day (BID) | ORAL | 1 refills | Status: AC

## 2021-05-07 MED ORDER — GUAIFENESIN-CODEINE 100-10 MG/5ML PO SOLN: 5.0000 mL | ORAL | 0 refills | Status: AC | PRN

## 2021-05-07 NOTE — ED Triage Notes (Signed)
Pt to ED via POV for headache that started on Friday afternoon. Pt denies fever, pt denies chills. Pt denies N/V. Pt states that he also has cough.

## 2021-05-07 NOTE — ED Provider Notes (Signed)
[facility] Emergency Department Provider Note   ____________________________________________   Event Date/Time   First MD Initiated Contact with Patient 05/07/21 [phone]     (approximate)  I have reviewed the triage vital signs and the nursing notes.   HISTORY  Chief Complaint Cough, Headache, and Covid Exposure   HPI Fernando Vasquez is a 50 y.o. male presents to the ED with complaint of cough, congestion and headache that started 2 days ago.  Patient denies any fever or chills.  He also denies any nausea, vomiting or diarrhea.  He is unaware of any change in taste or smell.  Patient is not aware of any known COVID exposure.  He states that he had 2 vaccines last year.  No one else in the family is sick at this time.  Patient states that he was possibly exposed to someone with COVID on 04/20/2021.  Patient also is a type II diabetic and has been taking metformin 500 mg twice daily until his doctor at [facility] retired.  He states that he has not followed up with the clinic to see if there is someone else that he can see and has gone without his medication for several months.  Patient is asking if his medication can be refilled until he is able to find a PCP.         Past Medical History:  Diagnosis Date   Diabetes mellitus without complication (HCC)    Elevated cholesterol    GERD (gastroesophageal reflux disease)     There are no problems to display for this patient.   Past Surgical History:  Procedure Laterality Date   APPENDECTOMY      Prior to Admission medications   Medication Sig Start Date End Date Taking? Authorizing Provider  guaiFENesin-codeine 100-10 MG/5ML syrup Take 5 mLs by mouth every 4 (four) hours as needed for cough. 05/07/21  Yes Tommi Rumps, PA-C  metFORMIN (GLUCOPHAGE) 500 MG tablet Take 1 tablet (500 mg total) by mouth 2 (two) times daily with a meal. 05/07/21 05/07/22 Yes Dermot Gremillion L, PA-C  omeprazole (PRILOSEC) 20 MG  capsule Take 20 mg by mouth daily.   Yes [provider]  simvastatin (ZOCOR) 40 MG tablet Take 40 mg by mouth daily.   Yes [provider]  sildenafil (REVATIO) 20 MG tablet Take 3-5 tablets by mouth as directed. 04/22/21   [provider]    Allergies Patient has no known allergies.  No family history on file.  Social History Social History   Tobacco Use   Smoking status: Never   Smokeless tobacco: Never  Substance Use Topics   Drug use: Not Currently    Review of Systems Constitutional: No fever/chills Eyes: No visual changes. ENT: No sore throat. Cardiovascular: Denies chest pain. Respiratory: Denies shortness of breath.  Positive for cough. Gastrointestinal: No abdominal pain.  No nausea, no vomiting.  No diarrhea.  Genitourinary: Negative for dysuria. Musculoskeletal: Negative for muscle aches. Skin: Negative for rash. Neurological: Positive for headache.  Negative for focal weakness or numbness.   ____________________________________________   PHYSICAL EXAM:  VITAL SIGNS: ED Triage Vitals  Enc Vitals Group     BP 05/07/21 0848 (!) 114/101     Pulse Rate 05/07/21 0848 (!) 103     Resp 05/07/21 0848 16     Temp 05/07/21 0848 98.1 F (36.7 C)     Temp Source 05/07/21 0848 Oral     SpO2 05/07/21 0848 98 %  Weight 05/07/21 0849 156 lb (70.8 kg)     Height 05/07/21 0849 5\' 6"  (1.676 m)     Head Circumference --      Peak Flow --      Pain Score 05/07/21 0849 5     Pain Loc --      Pain Edu? --      Excl. in GC? --     Constitutional: Alert and oriented. Well appearing and in no acute distress. Eyes: Conjunctivae are normal.  Head: Atraumatic. Nose: No congestion/rhinnorhea. Mouth/Throat: Mucous membranes are moist.  Oropharynx non-erythematous. Neck: No stridor.   Cardiovascular: Normal rate, regular rhythm. Grossly normal heart sounds.  Good peripheral circulation. Respiratory: Normal respiratory effort.  No retractions.  Lungs CTAB. Gastrointestinal: Soft and nontender. No distention.  Musculoskeletal: Moves upper and lower extremities without any difficulty normal gait was noted. Neurologic:  Normal speech and language. No gross focal neurologic deficits are appreciated. No gait instability. Skin:  Skin is warm, dry and intact. No rash noted. Psychiatric: Mood and affect are normal. Speech and behavior are normal.  ____________________________________________   LABS (all labs ordered are listed, but only abnormal results are displayed)  Labs Reviewed  CBG MONITORING, ED - Abnormal; Notable for the following components:      Result Value   Glucose-Capillary 228 (*)    All other components within normal limits  SARS CORONAVIRUS 2 (TAT 6-24 HRS)   ____________________________________________ ____________________________________________  RADIOLOGY 05/09/21, personally viewed and evaluated these images (plain radiographs) as part of my medical decision making, as well as reviewing the written report by the radiologist.   Official radiology report(s): DG Chest Port 1 View  Result Date: 05/07/2021 CLINICAL DATA:  50 year old male with cough and headache for 2 days. EXAM: PORTABLE CHEST 1 VIEW COMPARISON:  Chest radiographs 03/22/2018 and earlier. FINDINGS: Portable AP semi upright view at 0919 hours. Normal lung volumes and mediastinal contours. Visualized tracheal air column is within normal limits. Both lungs appear stable and clear. No pneumothorax or pleural effusion. No osseous abnormality identified. Negative visible bowel gas pattern. IMPRESSION: Negative portable chest. Electronically Signed   By: 03/24/2018 M.D.   On: 05/07/2021 09:41    ____________________________________________   PROCEDURES  Procedure(s) performed (including Critical Care):  Procedures   ____________________________________________   INITIAL IMPRESSION / ASSESSMENT AND PLAN / ED COURSE  As part of my  medical decision making, I reviewed the following data within the electronic MEDICAL RECORD NUMBER Notes from prior ED visits and [place] Controlled Substance Database   50 year old male presents to the ED with complaint of cough and congestion that started 2 days ago.  Patient also reports a dull headache.  He believes that he was exposed to COVID on 04/20/2021.  Patient has been vaccinated but did not receive any boosters as of today.  COVID test was done.  Chest x-ray was negative.  Patient's blood sugar was 228 glucose fingerstick in the ED which is a nonfasting sample.  Refill of his metformin 500 mg twice daily was sent to his pharmacy but patient is aware that he will need to contact the clinic at [facility] or obtain an appointment either at [facility], [facility] or [facility] for continued management of his diabetes.  A prescription for Robitussin-AC was sent to the pharmacy for his cough and congestion symptoms.  Patient is aware that he can see the results of the COVID test on MyChart and instructions were printed on  his discharge papers.  Chest x-ray at this time is reassuring and patient is ambulatory at the time of his discharge without any difficulties.  He is to return to the emergency department if his COVID test is positive and he develops any shortness of breath or difficulty breathing. ____________________________________________   FINAL CLINICAL IMPRESSION(S) / ED DIAGNOSES  Final diagnoses:  Viral URI with cough  Encounter for screening for COVID-19  Poorly controlled diabetes mellitus (HCC)  Medication refill     ED Discharge Orders          Ordered    guaiFENesin-codeine 100-10 MG/5ML syrup  Every 4 hours PRN        05/07/21 1056    metFORMIN (GLUCOPHAGE) 500 MG tablet  2 times daily with meals        05/07/21 1122             Note:  This document was prepared using Dragon voice recognition software and may include unintentional dictation  errors.    Tommi Rumps, PA-C 05/07/21 1130    Chesley Noon, MD 05/08/21 [phone]

## 2021-05-07 NOTE — Discharge Instructions (Addendum)
Read information about things today while you are waiting for your COVID test results.  If you are positive and you can see this results on MyChart you will need to remain out of work until August 6.  You will need to let everyone you have come in contact with know that you are positive. Also you will need to follow-up with your clinic in [facility] for continued management of your diabetes.  Other considerations is the [facility] which is free in [place].  [facility] or [facility] is based on your income.  These clinics are listed on your discharge papers with contact information and addresses. If your test is positive for COVID you will need to return to the emergency department if you develop any severe symptoms such as shortness of breath or difficulty breathing.

## 2021-05-08 LAB — SARS CORONAVIRUS 2 (TAT 6-24 HRS): SARS Coronavirus 2: POSITIVE — AB
